# Patient Record
Sex: Female | Born: 1990 | Race: Black or African American | Hispanic: No | Marital: Single | Smoking: Never smoker
Health system: Southern US, Community
[De-identification: ages and names within clinical notes are randomized; demographics above are authoritative.]

## PROBLEM LIST (undated history)

## (undated) DIAGNOSIS — J45909 Unspecified asthma, uncomplicated: Secondary | ICD-10-CM

## (undated) HISTORY — PX: TONSILLECTOMY: SUR1361

---

## 2009-08-17 ENCOUNTER — Emergency Department (HOSPITAL_COMMUNITY): Admission: EM | Admit: 2009-08-17 | Discharge: 2009-08-17 | Payer: Self-pay | Admitting: Family Medicine

## 2009-08-18 ENCOUNTER — Emergency Department (HOSPITAL_COMMUNITY): Admission: EM | Admit: 2009-08-18 | Discharge: 2009-08-18 | Payer: Self-pay | Admitting: Family Medicine

## 2009-11-03 ENCOUNTER — Emergency Department (HOSPITAL_COMMUNITY): Admission: EM | Admit: 2009-11-03 | Discharge: 2009-11-03 | Payer: Self-pay | Admitting: Emergency Medicine

## 2011-03-31 LAB — CULTURE, ROUTINE-ABSCESS

## 2014-03-25 ENCOUNTER — Emergency Department (HOSPITAL_COMMUNITY)
Admission: EM | Admit: 2014-03-25 | Discharge: 2014-03-25 | Disposition: A | Discharge status: Home / Self Care | Payer: Self-pay | Attending: Emergency Medicine | Admitting: Emergency Medicine

## 2014-03-25 ENCOUNTER — Encounter (HOSPITAL_COMMUNITY): Payer: Self-pay | Admitting: Emergency Medicine

## 2014-03-25 DIAGNOSIS — M549 Dorsalgia, unspecified: Secondary | ICD-10-CM

## 2014-03-25 DIAGNOSIS — J45909 Unspecified asthma, uncomplicated: Secondary | ICD-10-CM | POA: Insufficient documentation

## 2014-03-25 DIAGNOSIS — Y9389 Activity, other specified: Secondary | ICD-10-CM | POA: Insufficient documentation

## 2014-03-25 DIAGNOSIS — Y92410 Unspecified street and highway as the place of occurrence of the external cause: Secondary | ICD-10-CM | POA: Insufficient documentation

## 2014-03-25 DIAGNOSIS — IMO0002 Reserved for concepts with insufficient information to code with codable children: Secondary | ICD-10-CM | POA: Insufficient documentation

## 2014-03-25 HISTORY — DX: Unspecified asthma, uncomplicated: J45.909

## 2014-03-25 MED ORDER — IBUPROFEN 800 MG PO TABS: 800.0000 mg | ORAL_TABLET | Freq: Three times a day (TID) | ORAL | Status: DC

## 2014-03-25 MED ORDER — CYCLOBENZAPRINE HCL 10 MG PO TABS: 10.0000 mg | ORAL_TABLET | Freq: Two times a day (BID) | ORAL | Status: DC | PRN

## 2014-03-25 MED ORDER — HYDROCODONE-ACETAMINOPHEN 5-325 MG PO TABS
1.0000 | ORAL_TABLET | Freq: Four times a day (QID) | ORAL | Status: DC | PRN
Start: 2014-03-25 — End: 2019-10-21
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
## 2014-03-27 NOTE — ED Provider Notes (Signed)
Medical screening examination/treatment/procedure(s) were performed by non-physician practitioner and as supervising physician I was immediately available for consultation/collaboration.   EKG Interpretation None       Desteny Freeman R. Navarro Nine, MD 03/27/14 1514 

## 2019-10-21 ENCOUNTER — Encounter (HOSPITAL_COMMUNITY): Payer: Self-pay

## 2019-10-21 ENCOUNTER — Other Ambulatory Visit: Payer: Self-pay

## 2019-10-21 ENCOUNTER — Emergency Department (HOSPITAL_COMMUNITY)
Admission: EM | Admit: 2019-10-21 | Discharge: 2019-10-21 | Disposition: A | Discharge status: Home / Self Care | Payer: No Typology Code available for payment source | Attending: Emergency Medicine | Admitting: Emergency Medicine

## 2019-10-21 ENCOUNTER — Emergency Department (HOSPITAL_COMMUNITY): Payer: No Typology Code available for payment source

## 2019-10-21 DIAGNOSIS — M549 Dorsalgia, unspecified: Secondary | ICD-10-CM | POA: Insufficient documentation

## 2019-10-21 DIAGNOSIS — Y9389 Activity, other specified: Secondary | ICD-10-CM | POA: Insufficient documentation

## 2019-10-21 DIAGNOSIS — Y999 Unspecified external cause status: Secondary | ICD-10-CM | POA: Diagnosis not present

## 2019-10-21 DIAGNOSIS — Y92410 Unspecified street and highway as the place of occurrence of the external cause: Secondary | ICD-10-CM | POA: Diagnosis not present

## 2019-10-21 DIAGNOSIS — R600 Localized edema: Secondary | ICD-10-CM | POA: Insufficient documentation

## 2019-10-21 DIAGNOSIS — M79605 Pain in left leg: Secondary | ICD-10-CM | POA: Insufficient documentation

## 2019-10-21 DIAGNOSIS — J45909 Unspecified asthma, uncomplicated: Secondary | ICD-10-CM | POA: Diagnosis not present

## 2019-10-21 DIAGNOSIS — M25572 Pain in left ankle and joints of left foot: Secondary | ICD-10-CM | POA: Insufficient documentation

## 2019-10-21 DIAGNOSIS — M25552 Pain in left hip: Secondary | ICD-10-CM | POA: Diagnosis not present

## 2019-10-21 LAB — POC URINE PREG, ED: Preg Test, Ur: NEGATIVE

## 2019-10-21 MED ORDER — ONDANSETRON HCL 4 MG PO TABS
4.0000 mg | ORAL_TABLET | Freq: Once | ORAL | Status: AC
  Administered 2019-10-21: 4 mg via ORAL
  Filled 2019-10-21: qty 1

## 2019-10-21 MED ORDER — CYCLOBENZAPRINE HCL 10 MG PO TABS: 10.0000 mg | ORAL_TABLET | Freq: Two times a day (BID) | ORAL | 0 refills | Status: AC | PRN

## 2019-10-21 MED ORDER — OXYCODONE-ACETAMINOPHEN 5-325 MG PO TABS
1.0000 | ORAL_TABLET | Freq: Once | ORAL | Status: AC
  Administered 2019-10-21: 16:00:00 1 via ORAL
  Filled 2019-10-21: qty 1
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
## 2019-10-21 NOTE — Discharge Instructions (Addendum)
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
## 2020-04-04 ENCOUNTER — Encounter (HOSPITAL_COMMUNITY): Payer: Self-pay

## 2020-04-04 ENCOUNTER — Ambulatory Visit (HOSPITAL_COMMUNITY)
Admission: EM | Admit: 2020-04-04 | Discharge: 2020-04-04 | Disposition: A | Discharge status: Home / Self Care | Payer: Self-pay | Attending: Family Medicine | Admitting: Family Medicine

## 2020-04-04 ENCOUNTER — Other Ambulatory Visit: Payer: Self-pay

## 2020-04-04 DIAGNOSIS — M545 Low back pain, unspecified: Secondary | ICD-10-CM

## 2020-04-04 DIAGNOSIS — M62838 Other muscle spasm: Secondary | ICD-10-CM

## 2020-04-04 DIAGNOSIS — S161XXA Strain of muscle, fascia and tendon at neck level, initial encounter: Secondary | ICD-10-CM

## 2020-04-04 DIAGNOSIS — M542 Cervicalgia: Secondary | ICD-10-CM

## 2020-04-04 MED ORDER — IBUPROFEN 600 MG PO TABS: 600.0000 mg | ORAL_TABLET | Freq: Four times a day (QID) | ORAL | 0 refills | Status: DC | PRN

## 2020-04-04 MED ORDER — METHOCARBAMOL 500 MG PO TABS: 500.0000 mg | ORAL_TABLET | Freq: Two times a day (BID) | ORAL | 0 refills | Status: DC
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
## 2020-12-16 ENCOUNTER — Ambulatory Visit (HOSPITAL_COMMUNITY)
Admission: EM | Admit: 2020-12-16 | Discharge: 2020-12-16 | Disposition: A | Discharge status: Home / Self Care | Payer: Self-pay | Attending: Family Medicine | Admitting: Family Medicine

## 2020-12-16 ENCOUNTER — Other Ambulatory Visit: Payer: Self-pay

## 2020-12-16 ENCOUNTER — Encounter (HOSPITAL_COMMUNITY): Payer: Self-pay | Admitting: *Deleted

## 2020-12-16 ENCOUNTER — Ambulatory Visit (INDEPENDENT_AMBULATORY_CARE_PROVIDER_SITE_OTHER): Payer: Self-pay

## 2020-12-16 ENCOUNTER — Ambulatory Visit (HOSPITAL_COMMUNITY): Payer: Self-pay

## 2020-12-16 DIAGNOSIS — M79671 Pain in right foot: Secondary | ICD-10-CM

## 2020-12-16 DIAGNOSIS — S99921A Unspecified injury of right foot, initial encounter: Secondary | ICD-10-CM

## 2020-12-16 NOTE — ED Triage Notes (Signed)
Reports slipping down outside stairs 2 days ago, causing injury to right 2nd toe.  Swelling noted, pt ambulating with limp.  RLE CMS intact.

## 2020-12-16 NOTE — ED Provider Notes (Signed)
[facility]    CSN: [id number] Arrival date & time: 12/16/20  0820      History   Chief Complaint Chief Complaint  Patient presents with  . Foot Injury    HPI Beverly Ellis is a 29 y.o. female.   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  She has had pain with weightbearing, bruising and some tenderness.  She has been applying ice and taking ibuprofen.  She is here today to rule out fracture per the request of her employer as this injury occurred during work hours.  She denies any loss of sensation.  Past Medical History:  Diagnosis Date  . Asthma     There are no problems to display for this patient.   Past Surgical History:  Procedure Laterality Date  . TONSILLECTOMY      OB History   No obstetric history on file.      Home Medications    Prior to Admission medications   Medication Sig Start Date End Date Taking? Authorizing Provider  ibuprofen (ADVIL) 600 MG tablet Take 1 tablet (600 mg total) by mouth every 6 (six) hours as needed. 04/04/20  Yes Moshe Cipro, NP  methocarbamol (ROBAXIN) 500 MG tablet Take 1 tablet (500 mg total) by mouth 2 (two) times daily. 04/04/20   Moshe Cipro, NP    Family History Family History  Problem Relation Age of Onset  . Healthy Mother   . Healthy Father     Social History Social History   Tobacco Use  . Smoking status: Never Smoker  . Smokeless tobacco: Never Used  Vaping Use  . Vaping Use: Never used  Substance Use Topics  . Alcohol use: No  . Drug use: No     Allergies   Sulfa antibiotics   Review of Systems Review of Systems Pertinent negatives listed in HPI  Physical Exam Triage Vital Signs ED Triage Vitals  Enc Vitals Group     BP 12/16/20 0852 107/63     Pulse Rate 12/16/20 0852 83     Resp 12/16/20 0852 18     Temp 12/16/20 0852 98.5 F (36.9 C)     Temp Source  12/16/20 0852 Oral     SpO2 12/16/20 0852 96 %     Weight --      Height --      Head Circumference --      Peak Flow --      Pain Score 12/16/20 0851 7     Pain Loc --      Pain Edu? --      Excl. in [place]? --    No data found.  Updated Vital Signs BP 107/63   Pulse 83   Temp 98.5 F (36.9 C) (Oral)   Resp 18   LMP 11/29/2020 (Approximate)   SpO2 96%   Visual Acuity Right Eye Distance:   Left Eye Distance:   Bilateral Distance:    Right Eye Near:   Left Eye Near:    Bilateral Near:     Physical Exam General appearance: alert, well developed, well nourished, cooperative and in no distress Head: Normocephalic, without obvious abnormality, atraumatic Respiratory: Respirations even and unlabored, normal respiratory rate Heart: rate and rhythm normal. No gallop or murmurs noted on exam  Abdomen: BS +, no distention, no rebound tenderness, or no mass Extremities: Right 2nd toe, trace erythema,  tenderness PIP and DIP manipulation, no deformity noted  Skin: Skin color, texture, turgor normal. No rashes seen  Psych: Appropriate mood and affect. Neurologic: Mental status: Alert, oriented to person, place, and time, thought content appropriate.   UC Treatments / Results  Labs (all labs ordered are listed, but only abnormal results are displayed) Labs Reviewed - No data to display  EKG   Radiology DG Foot Complete Right  Result Date: 12/16/2020 CLINICAL DATA:  Fall 2 days ago with persistent toe pain, initial encounter EXAM: RIGHT FOOT COMPLETE - 3+ VIEW COMPARISON:  None. FINDINGS: There is no evidence of fracture or dislocation. There is no evidence of arthropathy or other focal bone abnormality. Soft tissues are unremarkable. IMPRESSION: No acute abnormality noted. Electronically Signed   By: Alcide Clever M.D.   On: 12/16/2020 09:16    Procedures Procedures (including critical care time)  Medications Ordered in UC Medications - No data to display  Initial  Impression / Assessment and Plan / UC Course  I have reviewed the triage vital signs and the nursing notes.  Pertinent labs & imaging results that were available during my care of the patient were reviewed by me and considered in my medical decision making (see chart for details).    Imaging negative for acute fracture.  Advised pain is likely related to soft tissue injury.  Continue RICE and NSAIDs as needed.  Note provided to return to work without restrictions next scheduled workday. Final Clinical Impressions(s) / UC Diagnoses   Final diagnoses:  Injury of toe on right foot, initial encounter     Discharge Instructions     No fracture noted on exam.    ED Prescriptions    None     PDMP not reviewed this encounter.   Bing Neighbors, [place] 12/16/20 [phone]

## 2020-12-16 NOTE — Discharge Instructions (Addendum)
No fracture noted on exam. Recommended ice, elevation, and alternating tylenol and ibuprofen.

## 2021-10-09 ENCOUNTER — Emergency Department (HOSPITAL_BASED_OUTPATIENT_CLINIC_OR_DEPARTMENT_OTHER)
Admission: EM | Admit: 2021-10-09 | Discharge: 2021-10-09 | Disposition: A | Discharge status: Home / Self Care | Payer: BLUE CROSS/BLUE SHIELD | Attending: Emergency Medicine | Admitting: Emergency Medicine

## 2021-10-09 ENCOUNTER — Ambulatory Visit
Admission: EM | Admit: 2021-10-09 | Discharge: 2021-10-09 | Disposition: A | Discharge status: Home / Self Care | Payer: BLUE CROSS/BLUE SHIELD

## 2021-10-09 ENCOUNTER — Other Ambulatory Visit: Payer: Self-pay

## 2021-10-09 ENCOUNTER — Encounter (HOSPITAL_BASED_OUTPATIENT_CLINIC_OR_DEPARTMENT_OTHER): Payer: Self-pay | Admitting: Emergency Medicine

## 2021-10-09 DIAGNOSIS — R519 Headache, unspecified: Secondary | ICD-10-CM

## 2021-10-09 DIAGNOSIS — Y92410 Unspecified street and highway as the place of occurrence of the external cause: Secondary | ICD-10-CM | POA: Diagnosis not present

## 2021-10-09 DIAGNOSIS — Z5321 Procedure and treatment not carried out due to patient leaving prior to being seen by health care provider: Secondary | ICD-10-CM | POA: Insufficient documentation

## 2021-10-09 NOTE — ED Notes (Signed)
Patient VS were to be reassessed but Patient states they are leaving the Department. Patient to be discharged accordingly.
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
## 2021-10-09 NOTE — ED Triage Notes (Signed)
Patient is being discharged from the Urgent Care and sent to the Emergency Department via POA. Per L. Lequita Halt PA- C, patient is in need of higher level of care due to new onset of headache. Patient is aware and verbalizes understanding of plan of care.  Vitals:   10/09/21 1549  BP: 115/76  Pulse: 77  Resp: 18  Temp: 98.8 F (37.1 C)  SpO2: 97%

## 2021-10-10 ENCOUNTER — Emergency Department (HOSPITAL_BASED_OUTPATIENT_CLINIC_OR_DEPARTMENT_OTHER)
Admission: EM | Admit: 2021-10-10 | Discharge: 2021-10-10 | Disposition: A | Discharge status: Home / Self Care | Payer: BLUE CROSS/BLUE SHIELD | Attending: Emergency Medicine | Admitting: Emergency Medicine

## 2021-10-10 ENCOUNTER — Encounter (HOSPITAL_BASED_OUTPATIENT_CLINIC_OR_DEPARTMENT_OTHER): Payer: Self-pay

## 2021-10-10 ENCOUNTER — Other Ambulatory Visit: Payer: Self-pay

## 2021-10-10 DIAGNOSIS — S161XXA Strain of muscle, fascia and tendon at neck level, initial encounter: Secondary | ICD-10-CM | POA: Diagnosis not present

## 2021-10-10 DIAGNOSIS — S0990XA Unspecified injury of head, initial encounter: Secondary | ICD-10-CM | POA: Diagnosis present

## 2021-10-10 DIAGNOSIS — S060X0A Concussion without loss of consciousness, initial encounter: Secondary | ICD-10-CM | POA: Insufficient documentation

## 2021-10-10 DIAGNOSIS — J45909 Unspecified asthma, uncomplicated: Secondary | ICD-10-CM | POA: Diagnosis not present

## 2021-10-10 DIAGNOSIS — Y92410 Unspecified street and highway as the place of occurrence of the external cause: Secondary | ICD-10-CM | POA: Insufficient documentation

## 2021-10-10 MED ORDER — METHOCARBAMOL 500 MG PO TABS: 500.0000 mg | ORAL_TABLET | Freq: Three times a day (TID) | ORAL | 0 refills | Status: DC | PRN
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
## 2021-10-10 NOTE — Discharge Instructions (Signed)
You were evaluated in the Emergency Department and after careful evaluation, we did not find any emergent condition requiring admission or further testing in the hospital.  Your exam/testing today was overall reassuring.  Symptoms seem to be due to muscle strain or spasm as well as a possible concussion.  Recommend mental and physical rest as we discussed, Tylenol or Motrin for pain.  Can use the muscle relaxer as needed for more significant pain.  Please return to the Emergency Department if you experience any worsening of your condition.  Thank you for allowing [place] to be a part of your care.
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
## 2023-03-12 DIAGNOSIS — F4323 Adjustment disorder with mixed anxiety and depressed mood: Secondary | ICD-10-CM | POA: Diagnosis not present

## 2023-04-17 DIAGNOSIS — F4323 Adjustment disorder with mixed anxiety and depressed mood: Secondary | ICD-10-CM | POA: Diagnosis not present

## 2023-07-25 DIAGNOSIS — Z1159 Encounter for screening for other viral diseases: Secondary | ICD-10-CM | POA: Diagnosis not present

## 2023-07-25 DIAGNOSIS — R5383 Other fatigue: Secondary | ICD-10-CM | POA: Diagnosis not present

## 2023-07-25 DIAGNOSIS — E049 Nontoxic goiter, unspecified: Secondary | ICD-10-CM | POA: Diagnosis not present

## 2023-07-25 DIAGNOSIS — Z01419 Encounter for gynecological examination (general) (routine) without abnormal findings: Secondary | ICD-10-CM | POA: Diagnosis not present

## 2023-07-25 DIAGNOSIS — N920 Excessive and frequent menstruation with regular cycle: Secondary | ICD-10-CM | POA: Diagnosis not present

## 2023-12-19 ENCOUNTER — Ambulatory Visit (HOSPITAL_COMMUNITY)
Admission: EM | Admit: 2023-12-19 | Discharge: 2023-12-19 | Disposition: A | Discharge status: Home / Self Care | Payer: Federal, State, Local not specified - PPO | Attending: Internal Medicine | Admitting: Internal Medicine

## 2023-12-19 ENCOUNTER — Encounter (HOSPITAL_COMMUNITY): Payer: Self-pay | Admitting: Emergency Medicine

## 2023-12-19 DIAGNOSIS — M7918 Myalgia, other site: Secondary | ICD-10-CM | POA: Diagnosis not present

## 2023-12-19 LAB — POCT URINALYSIS DIP (MANUAL ENTRY)
Bilirubin, UA: NEGATIVE
Blood, UA: NEGATIVE
Glucose, UA: NEGATIVE mg/dL
Leukocytes, UA: NEGATIVE
Nitrite, UA: NEGATIVE
Protein Ur, POC: NEGATIVE mg/dL
Spec Grav, UA: >=1.03 — AB (ref 1.010–1.025)
Urobilinogen, UA: 1 U/dL
pH, UA: 5.5 (ref 5.0–8.0)

## 2023-12-19 MED ORDER — IBUPROFEN 800 MG PO TABS: 800.0000 mg | ORAL_TABLET | Freq: Three times a day (TID) | ORAL | 0 refills | Status: AC | PRN

## 2023-12-19 MED ORDER — METHOCARBAMOL 500 MG PO TABS: 500.0000 mg | ORAL_TABLET | Freq: Every evening | ORAL | 0 refills | Status: AC | PRN

## 2023-12-19 NOTE — ED Triage Notes (Signed)
Pt c/o lower back/flank pain on right side since yesterday.   Denies urinary symptoms.

## 2023-12-19 NOTE — Discharge Instructions (Addendum)
Rest and elevate the affected painful area.   Apply warm compresses intermittently as needed.   Please take medications as directed As pain recedes, begin normal activities slowly as tolerated.   Please return to urgent care if symptoms persist.

## 2023-12-23 NOTE — ED Provider Notes (Signed)
[facility]    CSN: [id number] Arrival date & time: 12/19/23  1919      History   Chief Complaint Chief Complaint  Patient presents with   Flank Pain    HPI Beverly Ellis is a 32 y.o. female was to the urgent care with right lower back pain of 1 day duration.  Patient's symptoms started fairly abruptly and has been persistent.  Pain is of moderate severity.  Pain is constant, throbbing and aggravated by movement.  No known relieving factors.  Patient denies any dysuria, urgency or frequency.  No fever or chills.  No nausea or vomiting.  No falls or trauma.  No heavy lifting.Marland Kitchen   HPI  Past Medical History:  Diagnosis Date   Asthma     There are no active problems to display for this patient.   Past Surgical History:  Procedure Laterality Date   TONSILLECTOMY      OB History   No obstetric history on file.      Home Medications    Prior to Admission medications   Medication Sig Start Date End Date Taking? Authorizing Provider  ibuprofen (ADVIL) 800 MG tablet Take 1 tablet (800 mg total) by mouth every 8 (eight) hours as needed. 12/19/23  Yes Soumya Colson, Britta Mccreedy, MD  methocarbamol (ROBAXIN) 500 MG tablet Take 1 tablet (500 mg total) by mouth at bedtime as needed for muscle spasms. 12/19/23  Yes Filicia Scogin, Britta Mccreedy, MD    Family History Family History  Problem Relation Age of Onset   Healthy Mother    Healthy Father     Social History Social History   Tobacco Use   Smoking status: Never   Smokeless tobacco: Never  Vaping Use   Vaping status: Never Used  Substance Use Topics   Alcohol use: No   Drug use: No     Allergies   Sulfa antibiotics   Review of Systems Review of Systems As per HPI  Physical Exam Triage Vital Signs ED Triage Vitals  Encounter Vitals Group     BP 12/19/23 1949 113/72     Systolic BP Percentile --      Diastolic BP Percentile --      Pulse Rate 12/19/23 1949 81     Resp 12/19/23 1949 17     Temp 12/19/23  1949 98.3 F (36.8 C)     Temp Source 12/19/23 1949 Oral     SpO2 12/19/23 1949 97 %     Weight --      Height --      Head Circumference --      Peak Flow --      Pain Score 12/19/23 1948 7     Pain Loc --      Pain Education --      Exclude from Growth Chart --    No data found.  Updated Vital Signs BP 113/72 (BP Location: Left Arm)   Pulse 81   Temp 98.3 F (36.8 C) (Oral)   Resp 17   LMP 11/27/2023 (Approximate)   SpO2 97%   Visual Acuity Right Eye Distance:   Left Eye Distance:   Bilateral Distance:    Right Eye Near:   Left Eye Near:    Bilateral Near:     Physical Exam Vitals and nursing note reviewed.  Constitutional:      General: She is not in acute distress.    Appearance: She is not ill-appearing.  Cardiovascular:     Rate  and Rhythm: Normal rate and regular rhythm.     Pulses: Normal pulses.     Heart sounds: Normal heart sounds.  Pulmonary:     Effort: Pulmonary effort is normal.     Breath sounds: Normal breath sounds.  Abdominal:     General: Bowel sounds are normal.     Palpations: Abdomen is soft.     Tenderness: There is no guarding or rebound.  Musculoskeletal:        General: Normal range of motion.     Comments: Tenderness  of the right paraspinal muscle.  Neurological:     Mental Status: She is alert.      UC Treatments / Results  Labs (all labs ordered are listed, but only abnormal results are displayed) Labs Reviewed  POCT URINALYSIS DIP (MANUAL ENTRY) - Abnormal; Notable for the following components:      Result Value   Ketones, POC UA trace (5) (*)    Spec Grav, UA >=1.030 (*)    All other components within normal limits    EKG   Radiology No results found.  Procedures Procedures (including critical care time)  Medications Ordered in UC Medications - No data to display  Initial Impression / Assessment and Plan / UC Course  I have reviewed the triage vital signs and the nursing notes.  Pertinent labs &  imaging results that were available during my care of the patient were reviewed by me and considered in my medical decision making (see chart for details).     1.  Right paraspinal muscle pain: Gentle stretching exercises Ibuprofen 800 mg every 8 hours as needed for pain Robaxin 500 mg at bedtime as needed for muscle spasms-medication precautions given.  Patient advised not to drive or use heavy machinery after taking Robaxin Return precautions given No indication for imaging studies. Final Clinical Impressions(s) / UC Diagnoses   Final diagnoses:  Pain of paraspinal muscle     Discharge Instructions      Rest and elevate the affected painful area.   Apply warm compresses intermittently as needed.   Please take medications as directed As pain recedes, begin normal activities slowly as tolerated.   Please return to urgent care if symptoms persist.    ED Prescriptions     Medication Sig Dispense Auth. Provider   ibuprofen (ADVIL) 800 MG tablet Take 1 tablet (800 mg total) by mouth every 8 (eight) hours as needed. 21 tablet Simrit Gohlke, Britta Mccreedy, MD   methocarbamol (ROBAXIN) 500 MG tablet Take 1 tablet (500 mg total) by mouth at bedtime as needed for muscle spasms. 10 tablet Elizabelle Fite, Britta Mccreedy, MD      PDMP not reviewed this encounter.   Merrilee Jansky, MD 12/23/23 1351

## 2024-11-12 DIAGNOSIS — M5442 Lumbago with sciatica, left side: Secondary | ICD-10-CM | POA: Diagnosis not present
